# Patient Record
Sex: Female | Born: 1935 | Race: White | Hispanic: No | Marital: Married | Smoking: Never smoker
Health system: Southern US, Community
[De-identification: ages and names within clinical notes are randomized; demographics above are authoritative.]

## PROBLEM LIST (undated history)

## (undated) DIAGNOSIS — M1990 Unspecified osteoarthritis, unspecified site: Secondary | ICD-10-CM

## (undated) HISTORY — PX: COLON SURGERY: SHX602

## (undated) HISTORY — PX: SHOULDER SURGERY: SHX246

## (undated) HISTORY — PX: KNEE SURGERY: SHX244

## (undated) HISTORY — PX: HERNIA REPAIR: SHX51

## (undated) HISTORY — PX: FOOT SURGERY: SHX648

## (undated) HISTORY — DX: Unspecified osteoarthritis, unspecified site: M19.90

## (undated) HISTORY — PX: GALLBLADDER SURGERY: SHX652

## (undated) HISTORY — PX: THYROID SURGERY: SHX805

---

## 2007-08-07 ENCOUNTER — Ambulatory Visit: Payer: Self-pay | Admitting: Otolaryngology

## 2007-08-18 ENCOUNTER — Ambulatory Visit: Payer: Self-pay | Admitting: Otolaryngology

## 2007-09-25 ENCOUNTER — Ambulatory Visit: Payer: Self-pay | Admitting: Otolaryngology

## 2010-08-26 ENCOUNTER — Ambulatory Visit: Payer: Self-pay | Admitting: Family Medicine

## 2011-06-23 ENCOUNTER — Ambulatory Visit: Payer: Self-pay | Admitting: Internal Medicine

## 2012-03-24 ENCOUNTER — Ambulatory Visit: Payer: Self-pay

## 2012-12-10 ENCOUNTER — Encounter: Payer: Self-pay | Admitting: Podiatry

## 2012-12-10 ENCOUNTER — Ambulatory Visit (INDEPENDENT_AMBULATORY_CARE_PROVIDER_SITE_OTHER): Payer: Medicare Other

## 2012-12-10 ENCOUNTER — Ambulatory Visit (INDEPENDENT_AMBULATORY_CARE_PROVIDER_SITE_OTHER): Payer: Medicare Other | Admitting: Podiatry

## 2012-12-10 VITALS — BP 164/102 | HR 96 | Resp 16 | Ht 61.0 in | Wt 170.0 lb

## 2012-12-10 DIAGNOSIS — M79672 Pain in left foot: Secondary | ICD-10-CM

## 2012-12-10 DIAGNOSIS — M79609 Pain in unspecified limb: Secondary | ICD-10-CM

## 2012-12-10 DIAGNOSIS — M779 Enthesopathy, unspecified: Secondary | ICD-10-CM

## 2012-12-10 MED ORDER — TRIAMCINOLONE ACETONIDE 10 MG/ML IJ SUSP
10.0000 mg | Freq: Once | INTRAMUSCULAR | Status: AC
  Administered 2012-12-10: 10 mg

## 2012-12-11 NOTE — Progress Notes (Signed)
Subjective:     Patient ID: Colleen Bonilla, female   DOB: November 12, 1935, 77 y.o.   MRN: [id number]  HPI patient points to left foot stating it hurts underneath the metatarsals and at times makes it hard to walk   Review of Systems     Objective:   Physical Exam Neurovascular status intact with edema and extensive pain around the second metatarsal phalangeal joint    Assessment:     Acute capsulitis second metatarsophalangeal joint left foot    Plan:     Reviewed condition and x-rays and discussed possibility for flexor plate damage or dislocation. Today I did a proximal nerve block aspirated the joint and was able to get out of small amount of clear fluid and injected with a half cc of dexamethasone Kenalog combination reappoint her recheck

## 2013-01-01 ENCOUNTER — Encounter: Payer: Self-pay | Admitting: Podiatry

## 2013-01-07 ENCOUNTER — Encounter: Payer: Self-pay | Admitting: Podiatry

## 2013-01-07 ENCOUNTER — Ambulatory Visit (INDEPENDENT_AMBULATORY_CARE_PROVIDER_SITE_OTHER): Payer: Medicare Other | Admitting: Podiatry

## 2013-01-07 ENCOUNTER — Ambulatory Visit: Payer: Medicare Other | Admitting: Podiatry

## 2013-01-07 VITALS — BP 163/97 | HR 94 | Resp 16 | Ht 61.0 in | Wt 170.0 lb

## 2013-01-07 DIAGNOSIS — M2040 Other hammer toe(s) (acquired), unspecified foot: Secondary | ICD-10-CM

## 2013-01-07 DIAGNOSIS — M779 Enthesopathy, unspecified: Secondary | ICD-10-CM

## 2013-01-07 MED ORDER — DICLOFENAC SODIUM 75 MG PO TBEC: 75.0000 mg | DELAYED_RELEASE_TABLET | Freq: Two times a day (BID) | ORAL | Status: DC

## 2013-01-07 NOTE — Progress Notes (Signed)
Subjective:     Patient ID: Colleen Bonilla, female   DOB: Nov 03, 1935, 78 y.o.   MRN: [id number]  HPI patient states that she is still having pain in the plantar left foot. States the injection only helped for a short period of time   Review of Systems     Objective:   Physical Exam Neurovascular status intact with no health history changes noted and pain in the second MPJ when pressed    Assessment:     Continued capsulitis second MPJ left with digital deformity    Plan:     Advised on immobilization with boot that she has at home and placed on Voltaren 75 mg for the next several weeks to reduce inflammation. Instructed ultimately this could be a surgical issue

## 2013-01-19 IMAGING — CR DG CHEST 2V
1 series · 2 of 2 positions shown · non-contrast
Comparison: none

REASON FOR EXAM: unspecified chest pain
COMMENTS:

[Series 1: view not recorded · 0.17mm/px · 2 of 2 slices shown]
[im 1/2]
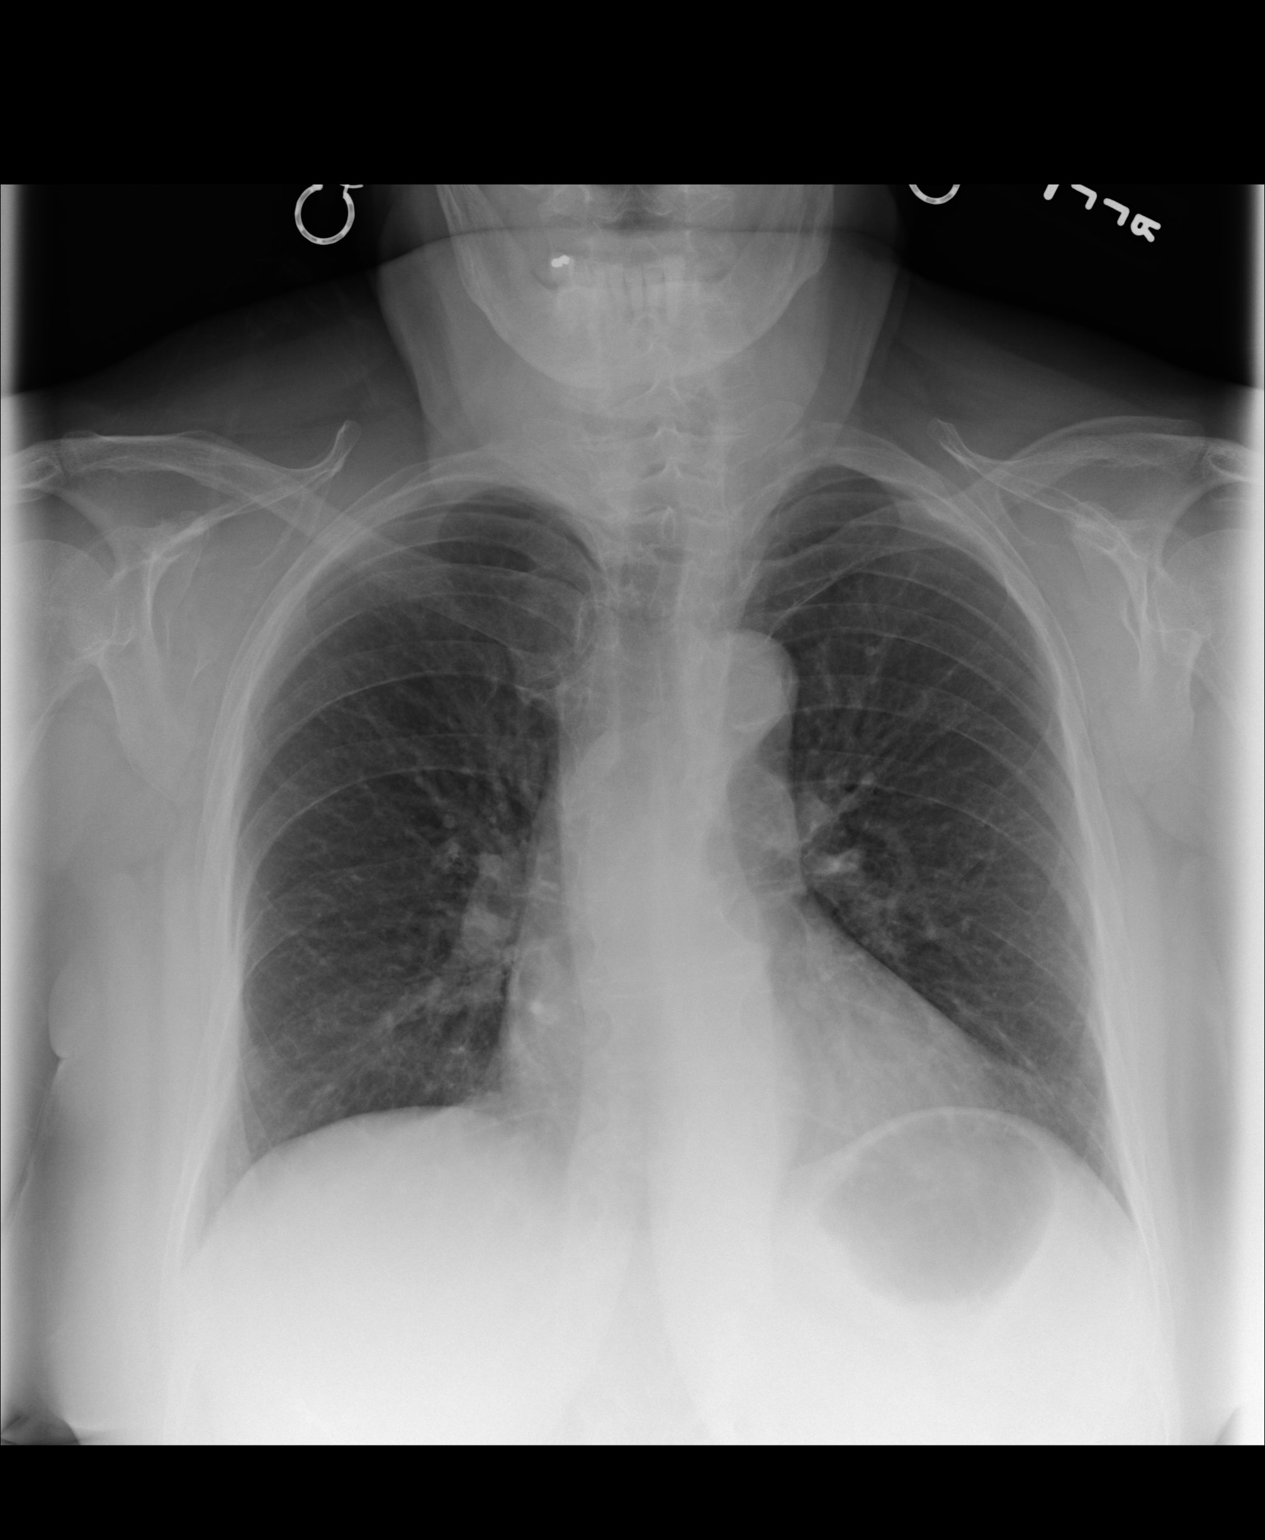
[im 2/2]
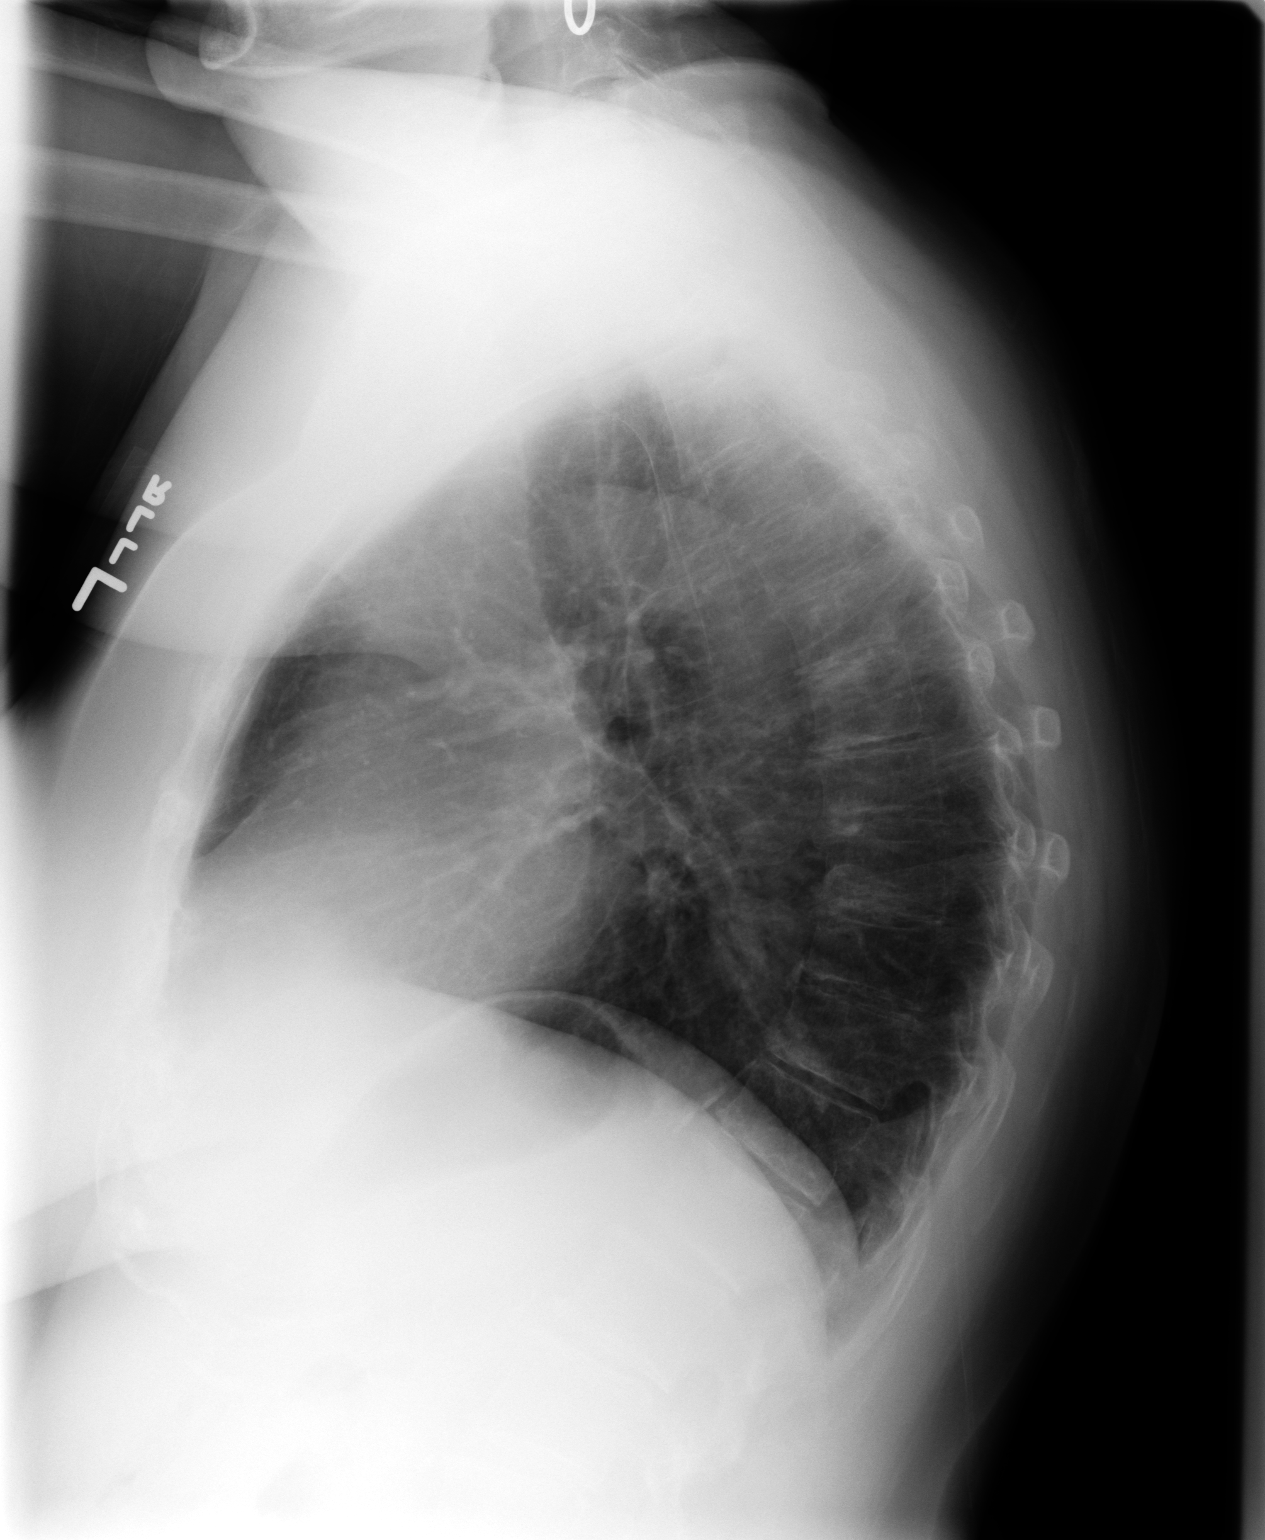

[2 of 2 positions shown; findings below may reference images not displayed]

PROCEDURE:     MDR - MDR CHEST PA(OR AP) AND LATERAL  - August 26, 2010 [DATE]

RESULT:     PA and lateral views of the chest show the lung fields to be
clear. No pneumonia, pneumothorax or pleural effusion is seen. No acute
changes of the heart or pulmonary vasculature are identified. No acute bony
abnormalities are seen. There is deformity of the left clavicle compatible
with residual change from prior injury. The right sternoclavicular joint is
not optimally visualized on this exam and could be better evaluated on
sternoclavicular views, if such are clinically indicated.
IMPRESSION: No acute changes are identified.

## 2013-02-04 ENCOUNTER — Encounter: Payer: Self-pay | Admitting: Podiatry

## 2013-02-04 ENCOUNTER — Ambulatory Visit (INDEPENDENT_AMBULATORY_CARE_PROVIDER_SITE_OTHER): Payer: Medicare Other | Admitting: Podiatry

## 2013-02-04 VITALS — BP 157/97 | HR 97 | Resp 16 | Ht 61.0 in | Wt 170.0 lb

## 2013-02-04 DIAGNOSIS — M779 Enthesopathy, unspecified: Secondary | ICD-10-CM

## 2013-02-04 MED ORDER — DICLOFENAC SODIUM 75 MG PO TBEC: 75.0000 mg | DELAYED_RELEASE_TABLET | Freq: Two times a day (BID) | ORAL | Status: DC

## 2013-02-04 NOTE — Progress Notes (Signed)
Subjective:     Patient ID: Colleen Bonilla, female   DOB: 07/19/1935, 78 y.o.   MRN: [id number]  HPI patient states that the pain in her joint is improved but still present with certain activities. States she has not resumed her normal exercise pattern   Review of Systems     Objective:   Physical Exam Neurovascular status intact with mild discomfort second MPJ left with some elevation of the toe and discomfort with deep   palpation Assessment:     Improved from capsulitis second MPJ left with discomfort still present and possible structural malalignment    Plan:     Instructed patient on this and at this time advised patient on physical therapy possibility orthotics resuming dancing and continued voltaren usage. Reappoint as needed

## 2013-06-12 ENCOUNTER — Ambulatory Visit: Payer: Self-pay | Admitting: Emergency Medicine

## 2015-01-01 ENCOUNTER — Encounter: Payer: Self-pay | Admitting: *Deleted

## 2015-01-01 ENCOUNTER — Ambulatory Visit
Admission: EM | Admit: 2015-01-01 | Discharge: 2015-01-01 | Disposition: A | Discharge status: Home / Self Care | Payer: Medicare Other | Attending: Family Medicine | Admitting: Family Medicine

## 2015-01-01 DIAGNOSIS — R05 Cough: Secondary | ICD-10-CM | POA: Diagnosis not present

## 2015-01-01 DIAGNOSIS — R059 Cough, unspecified: Secondary | ICD-10-CM

## 2015-01-01 MED ORDER — AZITHROMYCIN 250 MG PO TABS: ORAL_TABLET | ORAL | Status: DC

## 2015-01-01 NOTE — ED Notes (Signed)
Patient started having symptoms of nasal congestion, cough, and chest congestion. Symptoms have not improved and fever is now present. Mucus is a light green to clear in color.

## 2015-01-01 NOTE — ED Provider Notes (Signed)
CSN: [id number]     Arrival date & time 01/01/15  1348 History   First MD Initiated Contact with Patient 01/01/15 1526     Chief Complaint  Patient presents with  . Nasal Congestion  . Cough  . Fever   (Consider location/radiation/quality/duration/timing/severity/associated sxs/prior Treatment) Patient is a 79 y.o. female presenting with cough and fever. The history is provided by the patient.  Cough Cough characteristics:  Productive Sputum characteristics:  Yellow and green Severity:  Moderate Onset quality:  Sudden Duration:  1 week Timing:  Constant Progression:  Worsening Chronicity:  New Smoker: no   Associated symptoms: chills and fever   Associated symptoms: no chest pain, no diaphoresis, no ear fullness, no ear pain, no eye discharge, no rash, no shortness of breath, no sinus congestion and no wheezing   Fever Associated symptoms: chills and cough   Associated symptoms: no chest pain, no ear pain and no rash     Past Medical History  Diagnosis Date  . Osteoarthritis    Past Surgical History  Procedure Laterality Date  . Thyroid surgery    . Gallbladder surgery    . Hernia repair    . Knee surgery Left   . Shoulder surgery Right   . Foot surgery Left   . Colon surgery     History reviewed. No pertinent family history. Social History  Substance Use Topics  . Smoking status: Never Smoker   . Smokeless tobacco: Never Used  . Alcohol Use: No   OB History    No data available     Review of Systems  Constitutional: Positive for fever and chills. Negative for diaphoresis.  HENT: Negative for ear pain.   Eyes: Negative for discharge.  Respiratory: Positive for cough. Negative for shortness of breath and wheezing.   Cardiovascular: Negative for chest pain.  Skin: Negative for rash.    Allergies  Amoxicillin and Sulfa antibiotics  Home Medications   Prior to Admission medications   Medication Sig Start Date End Date Taking? Authorizing Provider   ibuprofen (ADVIL,MOTRIN) 200 MG tablet Take 200 mg by mouth as needed.   Yes Historical Provider, MD  azithromycin (ZITHROMAX Z-PAK) 250 MG tablet 2 tabs po once day 1, then 1 tab po qd for next 4 days 01/01/15   Payton Mccallumrlando Saamir Armstrong, MD  diclofenac (VOLTAREN) 75 MG EC tablet Take 1 tablet (75 mg total) by mouth 2 (two) times daily. 01/07/13   Lenn SinkNorman S Regal, DPM  diclofenac (VOLTAREN) 75 MG EC tablet Take 1 tablet (75 mg total) by mouth 2 (two) times daily. 02/04/13   Lenn SinkNorman S Regal, DPM   Meds Ordered and Administered this Visit  Medications - No data to display  BP 135/68 mmHg  Pulse 109  Temp(Src) 100.2 F (37.9 C) (Oral)  Resp 20  Ht 5\' 1"  (1.549 m)  Wt 178 lb (80.74 kg)  BMI 33.65 kg/m2  SpO2 97% No data found.   Physical Exam  Constitutional: She appears well-developed and well-nourished. No distress.  HENT:  Head: Normocephalic and atraumatic.  Right Ear: Tympanic membrane, external ear and ear canal normal.  Left Ear: Tympanic membrane, external ear and ear canal normal.  Nose: Mucosal edema present. No nose lacerations, sinus tenderness, nasal deformity, septal deviation or nasal septal hematoma. No epistaxis.  No foreign bodies.  Mouth/Throat: Uvula is midline, oropharynx is clear and moist and mucous membranes are normal. No oropharyngeal exudate, posterior oropharyngeal edema, posterior oropharyngeal erythema or tonsillar abscesses.  Eyes: Conjunctivae and  EOM are normal. Pupils are equal, round, and reactive to light. Right eye exhibits no discharge. Left eye exhibits no discharge. No scleral icterus.  Neck: Normal range of motion. Neck supple. No thyromegaly present.  Cardiovascular: Normal rate, regular rhythm and normal heart sounds.   Pulmonary/Chest: Effort normal. No respiratory distress. She has no wheezes. She has rales (right base).  Lymphadenopathy:    She has no cervical adenopathy.  Skin: She is not diaphoretic.  Nursing note and vitals reviewed.   ED Course   Procedures (including critical care time)  Labs Review Labs Reviewed - No data to display  Imaging Review No results found.   Visual Acuity Review  Right Eye Distance:   Left Eye Distance:   Bilateral Distance:    Right Eye Near:   Left Eye Near:    Bilateral Near:         MDM   1. Cough    (early/atypical pneumonia?)   Discharge Medication List as of 01/01/2015  3:48 PM    START taking these medications   Details  azithromycin (ZITHROMAX Z-PAK) 250 MG tablet 2 tabs po once day 1, then 1 tab po qd for next 4 days, Normal       1. diagnosis reviewed with patient 2. rx as per orders above; reviewed possible side effects, interactions, risks and benefits  3. Recommend supportive treatment with otc cough medication, analgesics prn 4. Follow-up prn if symptoms worsen or don't improve   Payton Mccallum, MD 01/01/15 1715

## 2015-06-07 ENCOUNTER — Ambulatory Visit
Admission: EM | Admit: 2015-06-07 | Discharge: 2015-06-07 | Disposition: A | Discharge status: Home / Self Care | Payer: Medicare Other | Attending: Emergency Medicine | Admitting: Emergency Medicine

## 2015-06-07 DIAGNOSIS — R21 Rash and other nonspecific skin eruption: Secondary | ICD-10-CM

## 2015-06-07 MED ORDER — TRIAMCINOLONE ACETONIDE 0.1 % EX CREA: 1.0000 "application " | TOPICAL_CREAM | Freq: Two times a day (BID) | CUTANEOUS | Status: AC

## 2015-06-07 NOTE — ED Provider Notes (Signed)
HPI  SUBJECTIVE:  LANEICE MENEELY is a 80 y.o. female who presents with an erythematous, vesicular rash on her distal right forearm starting 2 weeks ago. States that [place] had [place]. She reports a erythematous pruritic papules starting her left forearm just 2-3 days ago. States it is itchy all day long. Symptoms are better with cortisone cream no aggravating factors. She has tried cortisone rubbing alcohol for this. No nausea, vomiting, fevers, paresthesias preceding the rash. No known contact with poison ivy or poison oak, however she has been doing a lot of gardening recently. She does not take any medicines on a regular basis. No sensation being bitten at night, blood in the bedclothes in the morning. No new lotions, soaps, detergents. No pets in the home. No contacts with a similar rash. Her husband currently does not have a rash. There is no crusting. Past medical history negative for diabetes, hypertension. PMD: [facility].    Past Medical History  Diagnosis Date  . Osteoarthritis     Past Surgical History  Procedure Laterality Date  . Thyroid surgery    . Gallbladder surgery    . Hernia repair    . Knee surgery Left   . Shoulder surgery Right   . Foot surgery Left   . Colon surgery      History reviewed. No pertinent family history.  Social History  Substance Use Topics  . Smoking status: Never Smoker   . Smokeless tobacco: Never Used  . Alcohol Use: No    No current facility-administered medications for this encounter.  Current outpatient prescriptions:  .  triamcinolone cream (KENALOG) 0.1 %, Apply 1 application topically 2 (two) times daily. Apply for 2 weeks. May use on face, Disp: 30 g, Rfl: 0  Allergies  Allergen Reactions  . Amoxicillin Hives  . Sulfa Antibiotics Other (See Comments)    UNKNOWN     ROS  As noted in HPI.   Physical Exam  BP 150/85 mmHg  Pulse 84  Temp(Src) 97.9 F (36.6 C) (Oral)  Resp  20  Ht  (1.549 m)  Wt 180 lb (81.647 kg)  BMI 34.03 kg/m2  SpO2 100%  Constitutional: Well developed, well nourished, no acute distress Eyes:  EOMI, conjunctiva normal bilaterally HENT: Normocephalic, atraumatic,mucus membranes moist Respiratory: Normal inspiratory effort Cardiovascular: Normal rate GI: nondistended skin: Grouped vesicular rash with erythematous base on her distal right forearm. Several scattered erythematous papules on her medial left forearm. There is no other rash on her upper arms, torso, legs. No rash on the palms of her hands. No signs of infection. No crusting. No burrows. Musculoskeletal: no deformities Neurologic: Alert & oriented x 3, no focal neuro deficits Psychiatric: Speech and behavior appropriate   ED Course   Medications - No data to display  No orders of the defined types were placed in this encounter.    No results found for this or any previous visit (from the past 24 hour(s)). No results found.  ED Clinical Impression  Rash   ED Assessment/Plan  Could be contact dermatitis from poison ivy or poison oak, however, she denies any contact with this or re-exposure to garden tools. Unlikely shingles given that it is on both arms, however these could be completely different rashes. In the does not appear to be scabies or other infectious cause of her rash. We'll send home with triamcinolone, Claritin or Zyrtec, follow-up with [facility] or Dr. Ebony Cargo  in a week if no better.   Discussed MDM, plan and followup with patient   Patient agrees with plan.   *This clinic note was created using Dragon dictation software. Therefore, there may be occasional mistakes despite careful proofreading.  ?    Domenick GongAshley Casie Sturgeon, MD 06/07/15 1525

## 2015-06-07 NOTE — Discharge Instructions (Signed)
This is most likely a contact dermatitis start taking some Claritin or Zyrtec to help with the itching. The triamcinolone cream will also help. Follow-up with [facility] or Dr. beneath his Cheree DittoGraham in one week if you're not getting any better. Go to the ER if you start having fever, stiff neck, headache, or other concerns.
